# Patient Record
Sex: Male | Born: 1965 | Race: White | Hispanic: Yes | Marital: Married | Smoking: Never smoker
Health system: Southern US, Community
[De-identification: ages and names within clinical notes are randomized; demographics above are authoritative.]

## PROBLEM LIST (undated history)

## (undated) DIAGNOSIS — E7800 Pure hypercholesterolemia, unspecified: Secondary | ICD-10-CM

## (undated) DIAGNOSIS — K219 Gastro-esophageal reflux disease without esophagitis: Secondary | ICD-10-CM

## (undated) DIAGNOSIS — E119 Type 2 diabetes mellitus without complications: Secondary | ICD-10-CM

## (undated) HISTORY — DX: Gastro-esophageal reflux disease without esophagitis: K21.9

## (undated) HISTORY — DX: Pure hypercholesterolemia, unspecified: E78.00

## (undated) HISTORY — DX: Type 2 diabetes mellitus without complications: E11.9

---

## 2005-01-13 ENCOUNTER — Ambulatory Visit: Payer: Self-pay

## 2005-11-13 ENCOUNTER — Emergency Department: Payer: Self-pay | Admitting: Emergency Medicine

## 2005-12-02 ENCOUNTER — Ambulatory Visit: Payer: Self-pay | Admitting: Physician Assistant

## 2019-05-10 ENCOUNTER — Other Ambulatory Visit: Payer: Self-pay | Admitting: Family Medicine

## 2019-05-10 DIAGNOSIS — N50812 Left testicular pain: Secondary | ICD-10-CM

## 2019-05-16 ENCOUNTER — Other Ambulatory Visit: Payer: Self-pay

## 2019-05-16 ENCOUNTER — Ambulatory Visit
Admission: RE | Admit: 2019-05-16 | Discharge: 2019-05-16 | Disposition: A | Discharge status: Home / Self Care | Payer: BLUE CROSS/BLUE SHIELD | Source: Ambulatory Visit | Attending: Family Medicine | Admitting: Family Medicine

## 2019-05-16 DIAGNOSIS — N50812 Left testicular pain: Secondary | ICD-10-CM | POA: Diagnosis present

## 2019-06-12 ENCOUNTER — Other Ambulatory Visit: Payer: Self-pay

## 2019-06-12 ENCOUNTER — Encounter: Payer: Self-pay | Admitting: Urology

## 2019-06-12 ENCOUNTER — Ambulatory Visit: Payer: BLUE CROSS/BLUE SHIELD | Admitting: Urology

## 2019-06-12 VITALS — BP 126/84 | HR 62 | Ht 66.0 in

## 2019-06-12 DIAGNOSIS — N50819 Testicular pain, unspecified: Secondary | ICD-10-CM

## 2019-06-12 NOTE — Progress Notes (Signed)
06/12/2019 9:43 AM   Rachel Bo Feb 01, 1966 [id number]  Referring provider: Bunnie Pion, [place] [address],  [address] [address]  Chief Complaint  Patient presents with  . Penis Pain    HPI:  Wayne Kennedy was referred with left testicular pain.  He has had pain on and off for several months.  In fact he also has low back pain with radiculopathy.  He has had prior imaging for back pain.  He underwent a scrotal ultrasound on 05/16/2019 which was benign.  A urinalysis was negative.  Creatinine 0.86.  White count was 9.  PSA 0.5.  He was treated with doxycycline. Pain is intermittent. Walking and picking up heavy objects. He has a good stream and no dysuria. No gross hematuria. A few PV dribble,   Modifying factors: There are no other modifying factors  Associated signs and symptoms: There are no other associated signs and symptoms Aggravating and relieving factors: There are no other aggravating or relieving factors Severity: Moderate Duration: Persistent   PMH: History reviewed. No pertinent past medical history.  Surgical History: History reviewed. No pertinent surgical history.  Home Medications:  Allergies as of 06/12/2019   No Known Allergies     Medication List    as of June 12, 2019  9:43 AM   You have not been prescribed any medications.     Allergies: No Known Allergies  Family History: History reviewed. No pertinent family history.  Social History:  has no history on file for tobacco, alcohol, and drug.  ROS:                                        Physical Exam: There were no vitals taken for this visit.  Constitutional:  Alert and oriented, No acute distress. HEENT: [place] AT, moist mucus membranes.  Trachea midline, no masses. Cardiovascular: No clubbing, cyanosis, or edema. Respiratory: Normal respiratory effort, no increased work of breathing. GI: Abdomen is soft, nontender, nondistended, no abdominal masses  GU: No CVA tenderness; Penis uncircumcised, normal foreskin, testicles descended bilaterally and palpably normal, bilateral epididymis palpably normal, scrotum normal DRE: Prostate 25 g, smooth without hard area or nodule Lymph: No cervical or inguinal lymphadenopathy. Skin: No rashes, bruises or suspicious lesions. Neurologic: Grossly intact, no focal deficits, moving all 4 extremities. Psychiatric: Normal mood and affect.  Laboratory Data: No results found for: WBC, HGB, HCT, MCV, PLT  No results found for: CREATININE  No results found for: PSA  No results found for: TESTOSTERONE  No results found for: HGBA1C  Urinalysis No results found for: COLORURINE, APPEARANCEUR, LABSPEC, PHURINE, GLUCOSEU, HGBUR, BILIRUBINUR, KETONESUR, PROTEINUR, UROBILINOGEN, NITRITE, LEUKOCYTESUR  No results found for: LABMICR, [place], RBCUA, LABEPIT, MUCUS, BACTERIA  Pertinent Imaging: Scrotal US  No results found for this or any previous visit. No results found for this or any previous visit. No results found for this or any previous visit. No results found for this or any previous visit. No results found for this or any previous visit. No results found for this or any previous visit. No results found for this or any previous visit. No results found for this or any previous visit.  Assessment & Plan:    1. Pain in testicle, unspecified laterality Pain in the testicle is a commonly referred pain from the low back or hip.  Evaluation and treatment of low back and hip pain  should also improve the testicular pain.  He is clear for all activities from a urologic point of view. NSAIDs, rest and ice can help. Also we discussed the benign nature of cysts.  - Urinalysis, Complete   No follow-ups on file.  Jerilee Field, MD  [facility] [address], [address] [place], [place] [address] [phone]

## 2019-06-13 LAB — URINALYSIS, COMPLETE
Bilirubin, UA: NEGATIVE
Glucose, UA: NEGATIVE
Ketones, UA: NEGATIVE
Leukocytes,UA: NEGATIVE
Nitrite, UA: NEGATIVE
Protein,UA: NEGATIVE
RBC, UA: NEGATIVE
Specific Gravity, UA: 1.025 (ref 1.005–1.030)
Urobilinogen, Ur: 0.2 mg/dL (ref 0.2–1.0)
pH, UA: 5.5 (ref 5.0–7.5)

## 2019-06-13 LAB — MICROSCOPIC EXAMINATION
Bacteria, UA: NONE SEEN
RBC: NONE SEEN /hpf (ref 0–2)

## 2019-11-09 ENCOUNTER — Ambulatory Visit: Payer: Self-pay | Attending: Internal Medicine

## 2019-11-09 DIAGNOSIS — Z23 Encounter for immunization: Secondary | ICD-10-CM

## 2019-11-09 NOTE — Progress Notes (Signed)
   Covid-19 Vaccination Clinic  Name:  Hendrick Pavich    MRN: [id number] DOB: February 04, 1966  11/09/2019  Mr. Malloy was observed post Covid-19 immunization for 15 minutes without incident. He was provided with Vaccine Information Sheet and instruction to access the V-Safe system.   Mr. Spark was instructed to call 911 with any severe reactions post vaccine: Marland Kitchen Difficulty breathing  . Swelling of face and throat  . A fast heartbeat  . A bad rash all over body  . Dizziness and weakness   Immunizations Administered    Name Date Dose VIS Date Route   Pfizer COVID-19 Vaccine 11/09/2019  9:09 AM 0.3 mL 08/02/2019 Intramuscular   Manufacturer: [employer], [employer]   Lot: [id number]   NDC: [address]-4

## 2019-11-30 ENCOUNTER — Ambulatory Visit: Payer: Self-pay | Attending: Internal Medicine

## 2019-11-30 DIAGNOSIS — Z23 Encounter for immunization: Secondary | ICD-10-CM

## 2019-11-30 NOTE — Progress Notes (Signed)
   Covid-19 Vaccination Clinic  Name:  Maggie Dworkin    MRN: [id number] DOB: Sep 24, 1965  11/30/2019  Mr. Zirbes was observed post Covid-19 immunization for 15 minutes   without incident. He was provided with Vaccine Information Sheet and instruction to access the V-Safe system.   Mr. Antigua was instructed to call 911 with any severe reactions post vaccine: Marland Kitchen Difficulty breathing  . Swelling of face and throat  . A fast heartbeat  . A bad rash all over body  . Dizziness and weakness   Immunizations Administered    Name Date Dose VIS Date Route   Pfizer COVID-19 Vaccine 11/30/2019  9:09 AM 0.3 mL 08/02/2019 Intramuscular   Manufacturer: [employer], [employer]   Lot: [id number]   NDC: [address]-2

## 2020-07-11 ENCOUNTER — Ambulatory Visit: Payer: Self-pay

## 2020-08-01 ENCOUNTER — Ambulatory Visit: Payer: Self-pay | Attending: Internal Medicine

## 2020-08-01 DIAGNOSIS — Z23 Encounter for immunization: Secondary | ICD-10-CM

## 2020-08-01 NOTE — Progress Notes (Signed)
° °  Covid-19 Vaccination Clinic  Name:  Wayne Kennedy    MRN: [id number] DOB: 22-Jun-1966  08/01/2020  Mr. Bontempo was observed post Covid-19 immunization for 15 minutes without incident. He was provided with Vaccine Information Sheet and instruction to access the V-Safe system.   Mr. Gladd was instructed to call 911 with any severe reactions post vaccine:  Difficulty breathing   Swelling of face and throat   A fast heartbeat   A bad rash all over body   Dizziness and weakness   Immunizations Administered    Name Date Dose VIS Date Route   Pfizer COVID-19 Vaccine 08/01/2020  3:58 PM 0.3 mL 06/10/2020 Intramuscular   Manufacturer: [employer]   Lot: [id number]   NDC: [id number]

## 2021-03-04 IMAGING — US US SCROTUM W/ DOPPLER COMPLETE
1 series · 14 of 25 positions shown · non-contrast
Comparison: None

CLINICAL DATA: LEFT testicular pain for 2.5 weeks, rates at [DATE] in
severity

EXAM:
SCROTAL ULTRASOUND
DOPPLER ULTRASOUND OF THE TESTICLES
TECHNIQUE: Complete ultrasound examination of the testicles, epididymis, and
other scrotal structures was performed. Color and spectral Doppler
ultrasound were also utilized to evaluate blood flow to the
testicles.

[Series 1: us scrotum w/ doppler complete · 0.07mm/px · 14 of 63 slices shown]
[im 1/63]
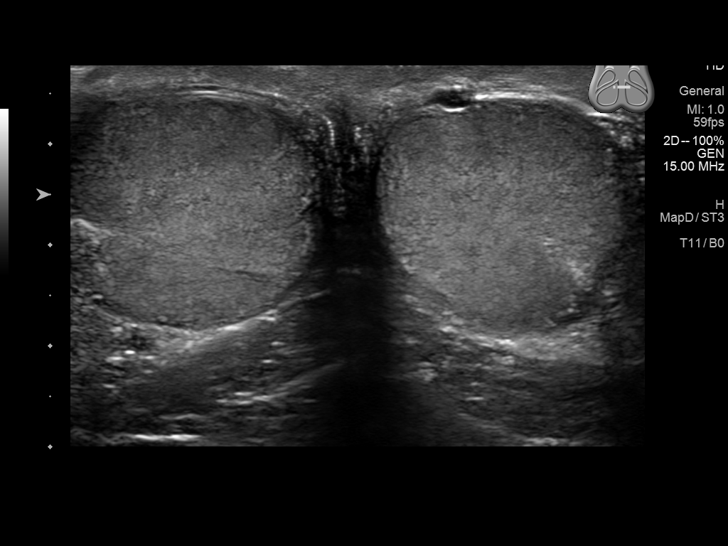
[im 6/63]
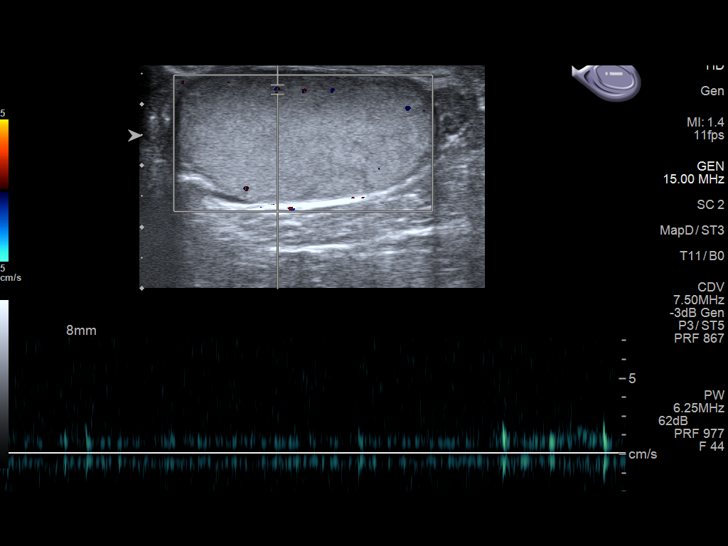
[im 11/63]
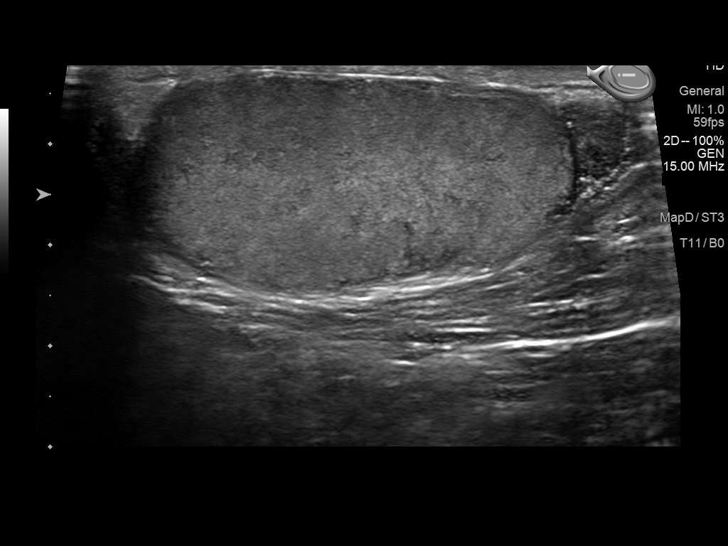
[im 16/63]
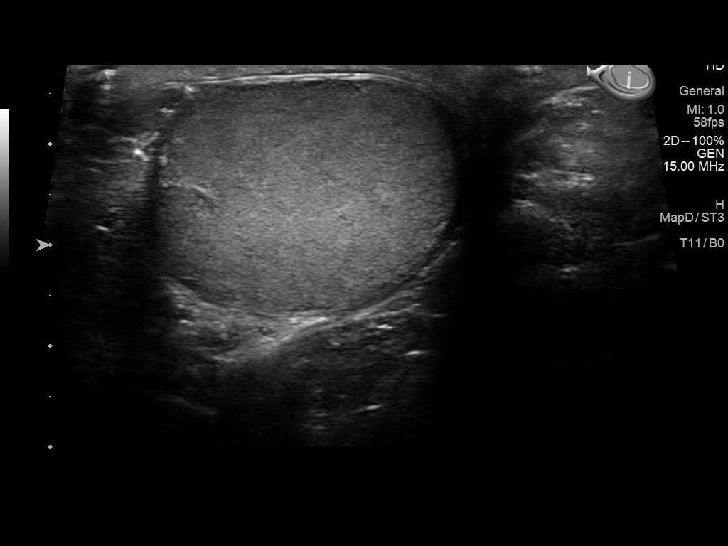
[im 21/63]
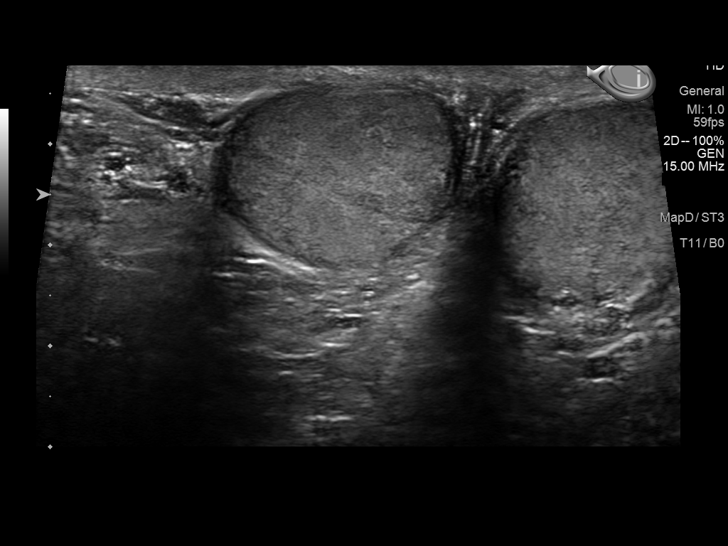
[im 24/63]
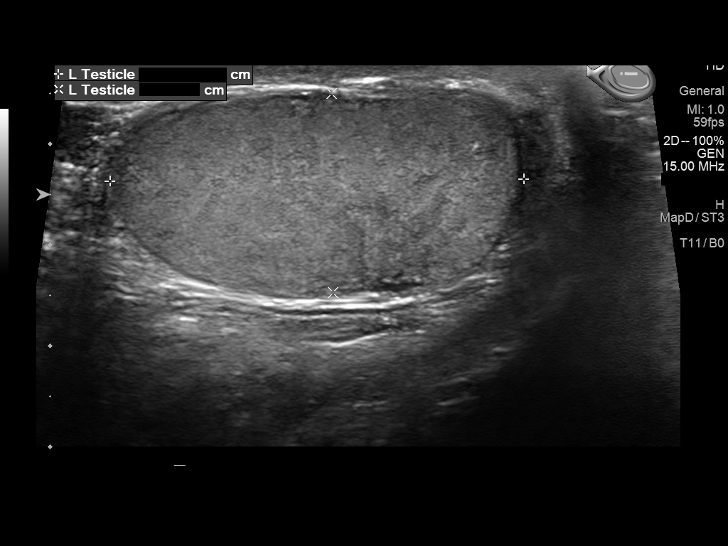
[im 29/63]
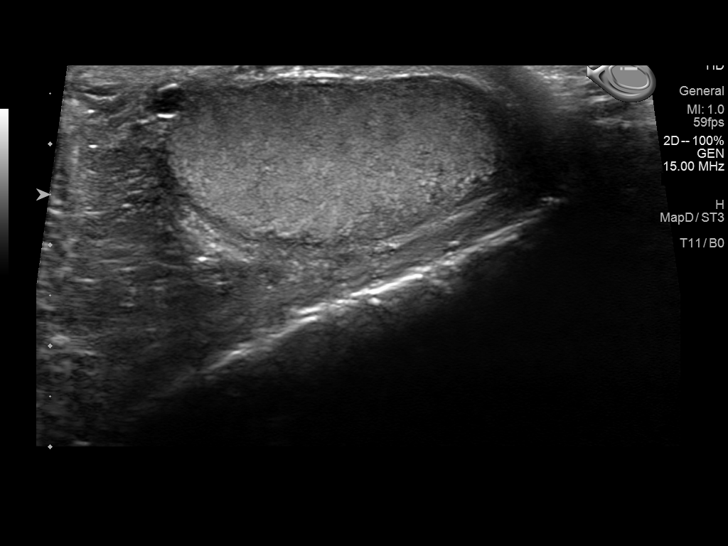
[im 34/63]
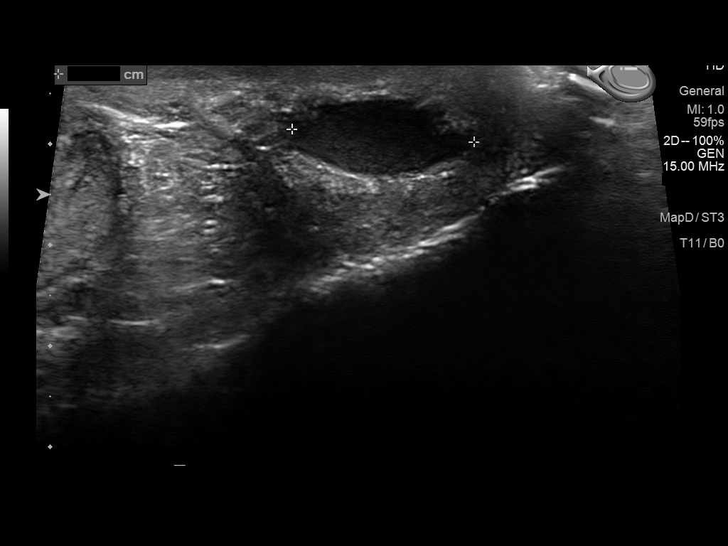
[im 39/63]
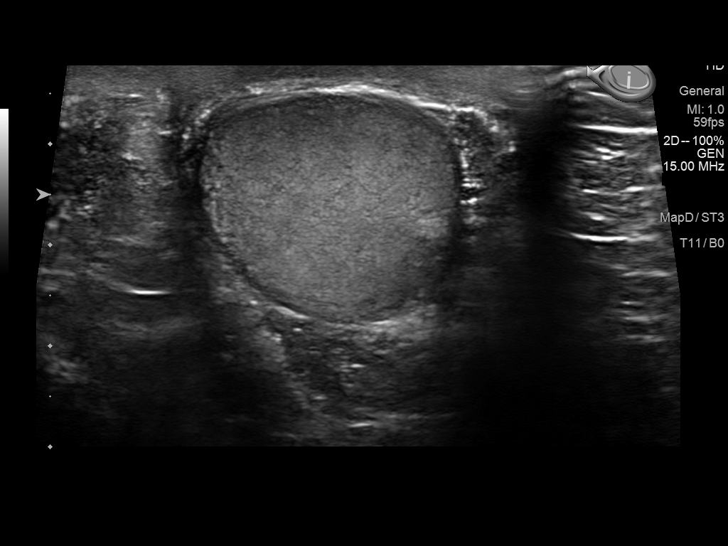
[im 42/63]
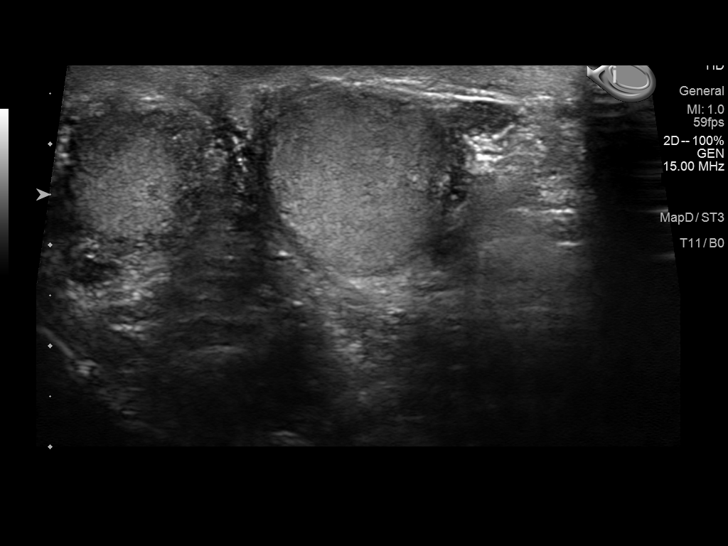
[im 47/63]
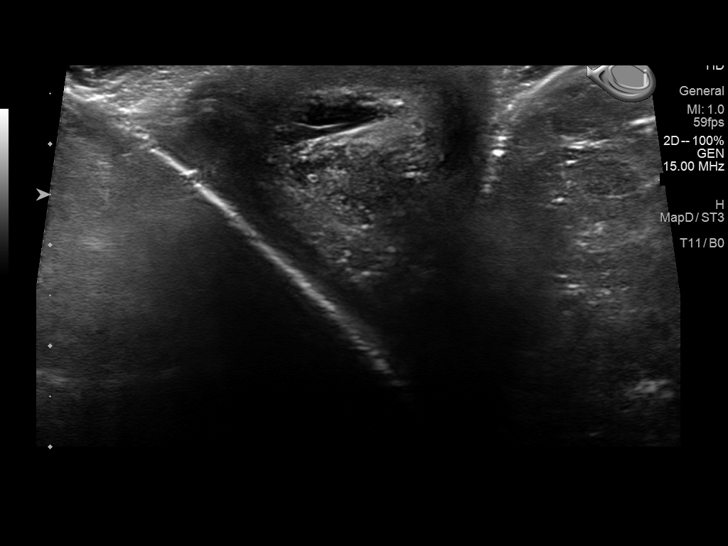
[im 52/63]
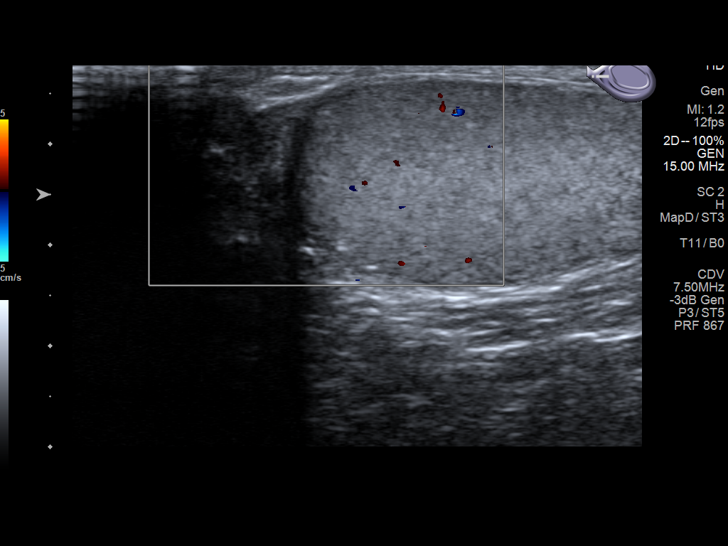
[im 57/63]
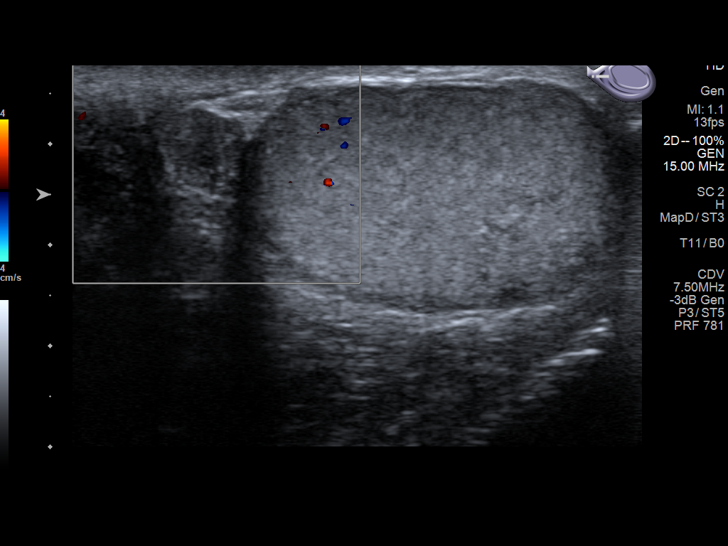
[im 63/63]
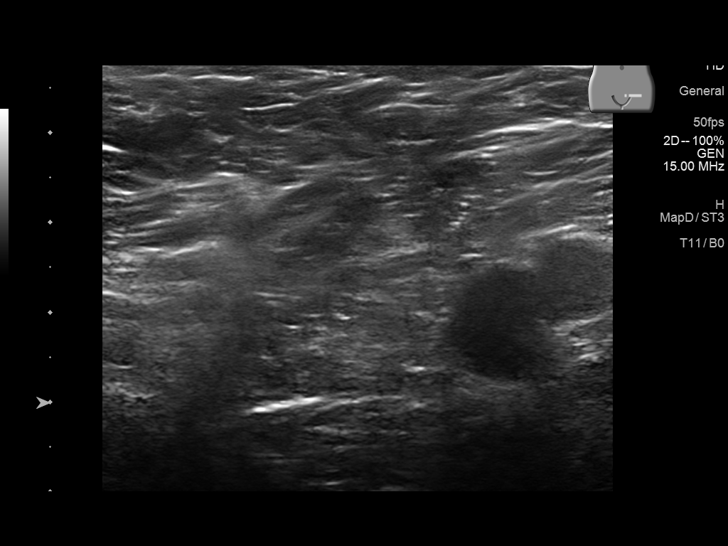

[14 of 25 positions shown; findings below may reference images not displayed]

FINDINGS: Right testicle

Measurements: 4.2 x 2.1 x 3.0 cm. Normal morphology without mass or
calcification. Internal blood flow present on color Doppler imaging.

Left testicle

Measurements: 4.1 x 2.0 x 2.5 cm. Tiny tunica cyst LEFT testis 3 x 3
x 4 mm. Normal morphology without additional mass or calcification.
Internal blood flow present on color Doppler imaging.

Right epididymis:  Normal in size and appearance.

Left epididymis:  Normal in size and appearance.

Hydrocele: Small BILATERAL hydroceles, both containing scattered
internal echogenicity/debris

Varicocele:  None visualized.

Pulsed Doppler interrogation of both testes demonstrates normal low
resistance arterial and venous waveforms bilaterally.
IMPRESSION: No significant scrotal sonographic abnormalities.
